# Patient Record
Sex: Female | Born: 1980 | Hispanic: Yes | Marital: Single | Smoking: Never smoker
Health system: Southern US, Community
[De-identification: ages and names within clinical notes are randomized; demographics above are authoritative.]

## PROBLEM LIST (undated history)

## (undated) DIAGNOSIS — R87619 Unspecified abnormal cytological findings in specimens from cervix uteri: Secondary | ICD-10-CM

## (undated) HISTORY — PX: NO PAST SURGERIES: SHX2092

## (undated) HISTORY — DX: Unspecified abnormal cytological findings in specimens from cervix uteri: R87.619

---

## 2017-06-30 ENCOUNTER — Encounter: Payer: Self-pay | Admitting: *Deleted

## 2017-06-30 ENCOUNTER — Encounter (INDEPENDENT_AMBULATORY_CARE_PROVIDER_SITE_OTHER): Payer: Self-pay

## 2017-06-30 ENCOUNTER — Ambulatory Visit: Payer: Self-pay | Attending: Oncology | Admitting: *Deleted

## 2017-06-30 VITALS — BP 175/116 | HR 86 | Temp 96.8°F | Resp 18 | Ht 65.0 in | Wt 206.0 lb

## 2017-06-30 DIAGNOSIS — R87810 Cervical high risk human papillomavirus (HPV) DNA test positive: Principal | ICD-10-CM

## 2017-06-30 DIAGNOSIS — R87610 Atypical squamous cells of undetermined significance on cytologic smear of cervix (ASC-US): Secondary | ICD-10-CM

## 2017-06-30 NOTE — Progress Notes (Signed)
36 year old Hispanic [facility] further evaluation of an abnormal pap of HPV positive ASCUS completed on 05/17/17.  Maritza, the interpreter present during the interview.  Reviewed pap results with the patient, and answered many questions.  Patient is very upset and anxious.  She is scheduled to see Dr. Jean RosenthalJackson on 07/16/17 @ 9:10.  Blood pressure elevated at 175/116 and then rechecked at 165/85.  She is to recheck her blood pressure at [facility] or [facility], and if remains higher than 140/90 she is to follow-up with her primary care provider.  Hand out on hypertention given to patient.  Offered support to the patient.  Encouraged to keep her appointment with Dr. Jean RosenthalJackson.  Patient has been screened for eligibility.  She does not have any insurance, Medicare or Medicaid.  She also meets financial eligibility.  Hand-out given on the Affordable Care Act.

## 2017-07-16 ENCOUNTER — Ambulatory Visit (INDEPENDENT_AMBULATORY_CARE_PROVIDER_SITE_OTHER): Payer: Self-pay | Admitting: Obstetrics and Gynecology

## 2017-07-16 ENCOUNTER — Encounter: Payer: Self-pay | Admitting: Obstetrics and Gynecology

## 2017-07-16 VITALS — BP 122/78 | Wt 206.0 lb

## 2017-07-16 DIAGNOSIS — R87610 Atypical squamous cells of undetermined significance on cytologic smear of cervix (ASC-US): Secondary | ICD-10-CM

## 2017-07-16 DIAGNOSIS — R87810 Cervical high risk human papillomavirus (HPV) DNA test positive: Secondary | ICD-10-CM

## 2017-07-16 NOTE — Progress Notes (Addendum)
Referring Provider:  [facility]  HPI:  Shelly Gillespie is a 36 y.o.  G2P2002  who presents today for evaluation and management of abnormal cervical cytology.    Dysplasia History:  ASCUS, HPV +   OB History  Gravida Para Term Preterm AB Living  2 2 2     2   SAB TAB Ectopic Multiple Live Births          2    # Outcome Date GA Lbr Len/2nd Weight Sex Delivery Anes PTL Lv  2 Term           1 Term               Past Medical History:  Diagnosis Date  . Abnormal Pap smear of cervix     History reviewed. No pertinent surgical history.  SOCIAL HISTORY:  Social History   Substance and Sexual Activity  Alcohol Use Yes  . Frequency: Never    Social History   Substance and Sexual Activity  Drug Use No     History reviewed. No pertinent family history.  ALLERGIES:  Patient has no known allergies.  No current outpatient medications on file prior to visit.   No current facility-administered medications on file prior to visit.     Physical Exam: -Vitals:  BP 122/78   Wt 206 lb (93.4 kg)   LMP 06/20/2017   BMI 34.28 kg/m  GEN: WD, WN, NAD.  A+ O x 3, good mood and affect. ABD:  NT, ND.  Soft, no masses.  No hernias noted.   Pelvic:   Vulva: Normal appearance.  No lesions.  Vagina: No lesions or abnormalities noted.  Support: Normal pelvic support.  Urethra No masses tenderness or scarring.  Meatus Normal size without lesions or prolapse.  Cervix: See below.  Anus: Normal exam.  No lesions.  Perineum: Normal exam.  No lesions.        Bimanual   Uterus: Normal size.  Non-tender.  Mobile.  AV.  Adnexae: No masses.  Non-tender to palpation.  Cul-de-sac: Negative for abnormality.   PROCEDURE: 1.  Urine Pregnancy Test:  negative 2.  Colposcopy performed with 4% acetic acid after verbal consent obtained                                         -Aceto-white Lesions Location(s): diffusely               -Biopsy performed at 2,  5, 7, and 10 o'clock               -ECC indicated and performed: No.       -Biopsy sites made hemostatic with pressure, AgNO3, and/or Monsel's solution   -Satisfactory colposcopy: Yes.        -Evidence of Invasive cervical CA :  NO  ASSESSMENT:  Shelly Gillespie is a 36 y.o. [id number] here for  1. ASCUS with positive high risk HPV cervical   .  PLAN: I discussed the grading system of pap smears and HPV high risk viral types.  We will discuss and base management after colpo results return.       Thomasene MohairStephen Lorece Keach, MD  [facility], [facility] 07/16/2017  4:58 PM   CC: [facility]   [facility] [place], Isa RankinKimberly Niles, MD [address] [place], [place] [id number]

## 2017-07-26 LAB — PATHOLOGY

## 2017-07-30 ENCOUNTER — Telehealth: Payer: Self-pay | Admitting: Obstetrics and Gynecology

## 2017-07-30 NOTE — Telephone Encounter (Signed)
Left generic VM using Spanish interpreter [phone]([id number]).

## 2017-07-30 NOTE — Telephone Encounter (Signed)
Pt is returning call. Please advise  °

## 2017-08-04 NOTE — Telephone Encounter (Signed)
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   Discussed risk of progression to cancer, if procedure is not done.   She voiced understanding and agreement to proceed.  Interpreter [phone]  Thomasene MohairStephen Rayven Rettig, MD 08/04/2017 1:37 PM

## 2017-08-05 ENCOUNTER — Telehealth: Payer: Self-pay | Admitting: *Deleted

## 2017-08-05 NOTE — Telephone Encounter (Addendum)
Left message via Maryjane HurterOtto, the interpreter for the patient to return my call.  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 Jean RosenthalJackson would like to do a LEEP.  If she is a [place] citizen, I can have her apply for [facility] Medicaid.  Patient returned my call via the interpreter Erin.  States she is not a [place] citizen, nor does she have a permanent residency card.  I cannot get the patient Medicaid.  Discussed options of self pay or to complete patient financial paperwork.  Since I could guarantee how long it would take to get financial approval, she wants to be self pay.  Called Marella BileNancy Cohen at [facility] and informed her of patients decision.  She is going to call and inform the patient how much the procedure will cost and get her scheduled.  Harriett Sineancy is to inform the patient she can call me back if she changes her mind.

## 2017-08-06 ENCOUNTER — Telehealth: Payer: Self-pay | Admitting: Obstetrics and Gynecology

## 2017-08-06 NOTE — Telephone Encounter (Signed)
-----   Message from Conard NovakStephen D Jackson, MD sent at 08/04/2017  1:38 PM EST ----- Regarding: schedule LEEP Surgery Booking Request Patient Full Name:  Shelly Gillespie  MRN: [id number]  DOB: 1981-04-25  Surgeon: Thomasene MohairStephen Jackson, MD  Requested Surgery Date and Time: when possible Primary Diagnosis AND Code: CIN 2 Secondary Diagnosis and Code:  Surgical Procedure: LEEP procedure L&D Notification: No Admission Status: IN OFFICE PROCEDURE Length of Surgery: 45 minutes Special Case Needs: Spanish language interpreter H&P: n/a (date) Phone Interview???: n/a Interpreter: Spanish Language Language:  Medical Clearance: none Special Scheduling Instructions: schedule a little extra time due to need for translator.

## 2017-08-06 NOTE — Telephone Encounter (Signed)
I contacted Christy @ [facility] yesterday, since the patient's colpo was [facility], to see if the LEEP would also be covered. I received a call back from [place], who said the patient is not a [place] Citizen, so [facility] will not cover, but that she offered for the patient to apply for financial assistance through [facility], or she could self-pay. Sheena said the patient did not want to wait for an application through [facility] to process, and was interested in self-pay. I contacted the patient through [facility] Johnny BridgeMartha, [place] [phone], and confirmed that the patient wanted to self-pay. I gave her the cost of the procedure with the self-pay discount, which needs to be paid in full the day of the procedure, and gave her the total cost of the procedure without the discount, which she could pay on a payment plan after the procedure. The patient chose the self-pay day of procedure option, and understands if she does not pay that day, that the total cost will be billed to her afterwards, and the self- pay discount will not apply (this information was confirmed with Molli KnockFelicia George earlier today). I also suggested that the patient call Sheena about possibly pursuing financial assistance since her appointment is 2 week out, in case they are able to process the application within that time frame, and patient understands that is up to her if she wants to pursue that option.

## 2017-08-23 ENCOUNTER — Ambulatory Visit: Payer: Self-pay | Admitting: Obstetrics and Gynecology

## 2017-08-23 ENCOUNTER — Telehealth: Payer: Self-pay | Admitting: Obstetrics and Gynecology

## 2017-08-23 NOTE — Telephone Encounter (Signed)
Pt is calling to reschedule due mense. Per SDJ patient to reschedule 08/23/17. Please call and reschedule with Interupter.

## 2017-08-24 NOTE — Telephone Encounter (Signed)
I spoke to the patient with [address] [employer], [place], [place]# [phone], and the patient is rescheduled for Friday, 09/10/17 @ 8:30am.

## 2017-09-10 ENCOUNTER — Encounter: Payer: Self-pay | Admitting: Obstetrics and Gynecology

## 2017-09-10 ENCOUNTER — Ambulatory Visit (INDEPENDENT_AMBULATORY_CARE_PROVIDER_SITE_OTHER): Payer: Self-pay | Admitting: Obstetrics and Gynecology

## 2017-09-10 VITALS — BP 118/76 | Wt 210.0 lb

## 2017-09-10 DIAGNOSIS — D069 Carcinoma in situ of cervix, unspecified: Secondary | ICD-10-CM | POA: Insufficient documentation

## 2017-09-10 DIAGNOSIS — N871 Moderate cervical dysplasia: Secondary | ICD-10-CM

## 2017-09-10 LAB — POCT URINE PREGNANCY: Preg Test, Ur: NEGATIVE

## 2017-09-10 NOTE — Patient Instructions (Signed)
Prevencin del cncer de cuello uterino Preventing Cervical Cancer El cncer de cuello uterino es un tipo de cncer que se desarrolla en el cuello uterino. El cuello uterino est en la parte inferior del tero. Conecta el tero con la vagina. El tero es el lugar donde el beb se desarrolla durante el embarazo. El cncer ocurre cuando algunas clulas comienzan a tener anomalas y crecen de forma descontrolada. El cncer de cuello uterino se desarrolla lentamente y podra no causar ningn sntoma al principio. Con el paso del [place], el cncer puede desarrollarse a mayor profundidad en el tejido del cuello uterino y expandirse a otras zonas. Si se halla a tiempo, el cncer de cuello uterino puede tratarse de forma eficaz. Tambin puede tomar medidas para prevenir este tipo de cncer. La mayora de los casos de cncer de cuello uterino son causados por una ITS (infeccin de transmisin sexual) a raz de un virus llamado virus del Geneticist, molecular (VPH). Una forma de disminuir el riesgo de Geophysical data processor de cuello uterino es evitar la infeccin por el VPH. Shelly Gillespie, debe practicar sexo con proteccin y colocarse la vacuna contra el VPH. Tambin es importante que se realice pruebas de Papanicolaou con regularidad, ya que estas pueden ayudar a identificar cambios en las clulas que podran Multimedia programmer. Las posibilidades de Environmental education officer esta enfermedad tambin pueden reducirse realizando ciertos cambios en el estilo de vida. Cmo puedo protegerme contra el cncer de cuello uterino? Prevencin contra la infeccin por VPH  Pregntele a su mdico acerca de cmo recibir la Animal nutritionist VPH. Si tiene 26aos o menos, es posible que deba colocarse esta vacuna, que se administra en tres dosis durante . Esta vacuna la protege contra los tipos de VPH que podran Multimedia programmer.  Limite la cantidad de personas con las que [employer]. Adems, evite tener sexo con personas que han  tenido demasiadas parejas sexuales.  Use preservativos de ltex durante las relaciones sexuales. Realizacin de las pruebas de Papanicolaou  Desde los 21aos, realcese pruebas de Papanicolaou con regularidad. Hable con el mdico sobre la frecuencia con la que debe hacerse estas pruebas. ? [employer] de las mujeres que Kaplan [id number] deben realizarse una prueba de Papanicolaou cada 3aos. ? Katha Hamming de las mujeres que Trona [id number] deben realizarse una prueba de Papanicolaou y Neomia Dear prueba del VPH cada 5aos. ? Las mujeres que presentan un mayor riesgo de Geophysical data processor de cuello uterino, como aquellas con un debilitamiento del sistema inmunitario o aquellas que han estado expuestas al frmaco dietilestilbestrol (DES), posiblemente deban realizarse las pruebas con mayor frecuencia. Realizacin de otros cambios en el estilo de vida  No consuma ningn producto que contenga nicotina o tabaco, como cigarrillos y Administrator, Civil Service. Si necesita ayuda para dejar de fumar, consulte al mdico.  Consuma, como mnimo, 5porciones de frutas y verduras todos [employer].  Baje de peso si es necesario. Por qu son importantes estos cambios?  Con estos cambios y [address] de [address], se abordan los factores que, segn los expertos, [place] el riesgo de Marine scientist cncer de cuello uterino. La mejor forma de disminuir el riesgo es seguir estos pasos.  Realizarse pruebas de Papanicolaou con regularidad ayudar a identificar cambios en las clulas que podran Multimedia programmer. Luego, se podrn tomar medidas para evitar el desarrollo del cncer.  Estos cambios tambin ayudarn a Market researcher de cuello uterino a tiempo. Si se halla a tiempo, el cncer de cuello uterino puede tratarse de forma eficaz.  Puede ser ms peligroso y difcil de tratar si el cncer se ha desarrollado a mayor profundidad en el cuello uterino o si se ha expandido.  Adems de hacer que sea menos propenso  a padecer cncer de cuello uterino, estos cambios [place]. I. du Ponttambin le otorgarn otros beneficios a su salud, como los que se describen a continuacin: ? Practicar sexo con proteccin es importante para prevenir las ITS y los embarazos no deseados. ? Evitar el consumo de tabaco puede disminuir el riesgo de padecer otros tipos de cncer y problemas cardacos. ? Seguir una dieta saludable y mantener un peso saludable son buenos para su salud general. Qu puede suceder si no se hacen cambios? En su etapa inicial, el cncer de cuello uterino posiblemente no tenga sntomas. El cncer puede tardar varios aos en desarrollarse y expandirse a mayor profundidad en el tejido del cuello uterino. Esta situacin podra ocurrir sin que usted lo sepa. Si desarrolla algn sntoma, como dolor en la pelvis o alguna secrecin o sangrado inusuales de la vagina, debe consultar con su mdico de inmediato. Si el cncer de cuello uterino no se halla a tiempo, podra tener que someterse a ciertos tratamientos, como radiacin, quimioterapia o Azerbaijanciruga. En algunos casos, realizarse una ciruga podra implicar que no podr quedar embarazada o completar un embarazo con xito. Dnde encontrar apoyo: Hable con el mdico, el enfermero de la escuela o el departamento de salud de su localidad para obtener orientacin sobre los estudios de deteccin y la vacunacin. Es posible que algunos nios y adolescentes puedan colocarse la vacuna contra el VPH sin cargo por medio del programa Vacunas para Nios (Vaccines for Emerson ElectricChildren,VFC) del gobierno de EE.[place]. Otros lugares en los que se vacuna:  Clnicas de salud pblica. Pregunte al departamento de salud de su localidad.  Centros de salud federalmente calificados, donde solo debera pagar lo que est a su alcance. Para encontrar alguno cerca de su lugar de residencia, consulte este sitio web: http://weiss.info/www.fqhc.org/find-an-fqhc/.  Clnicas de salud rural. Estas son parte de un programa para pacientes de  Medicare y de [place] que viven en zonas rurales.  El ShawnachesterPrograma Nacional de Deteccin Temprana del Cncer de Lime VillageMama y de Cuello Uterino de EE.[place] tambin proporciona exmenes de deteccin y servicios de diagnstico de estos tipos de cncer a mujeres de bajos recursos, sin cobertura mdica o con coberturas inadecuadas. El cncer de cuello uterino puede transmitirse de [place]res a hijas. Hable con el mdico o el asesor gentico para obtener ms informacin sobre las pruebas genticas para Management consultantel cncer. Dnde encontrar ms informacin: Puede obtener ms informacin sobre el cncer de cuello uterino Teachers Insurance and Annuity Associationen los siguientes sitios:  Colegio Americano de Print production plannerGinecologa y Ship brokerbstetricia (American College of Gynecology): HardChecks.bewww.acog.org/Patients/FAQs/Cervical-Cancer  Sociedad Education officer, communityAmericana contra el Cncer (American Cancer Society): www.cancer.org/cancer/cervicalcancer/  Centros para el Control y Psychiatristla Prevencin de Enfermedades(Centers for Disease Control and Prevention, CDC):www.cdc.gov/cancer/cervical/  Resumen  Converse con su mdico acerca de cmo recibir la Animal nutritionistvacuna contra el VPH.  Asegrese de realizarse pruebas de Papanicolaou con regularidad segn las recomendaciones del mdico.  Si siente algn dolor en la pelvis o tiene alguna secrecin o sangrado inusuales de la vagina, consulte con su mdico de inmediato. Esta informacin no tiene Theme park managercomo fin reemplazar el consejo del mdico. Asegrese de hacerle al mdico cualquier pregunta que tenga. Document Released: 10/28/2016 Document Revised: 10/28/2016 Document Reviewed: 08/04/2015 Elsevier Interactive Patient Education  2018 ArvinMeritorElsevier Inc.   Procedimiento de escisin electroquirrgica con asa, cuidados posteriores (Loop Electrosurgical Excision Procedure, Care After) Siga estas instrucciones durante  las prximas semanas. Estas indicaciones le proporcionan informacin acerca de cmo deber cuidarse despus del procedimiento. El mdico tambin podr darle instrucciones ms  especficas. El tratamiento ha sido planificado segn las prcticas mdicas actuales, pero en algunos casos pueden ocurrir problemas. Comunquese con el mdico si tiene algn problema o dudas despus del procedimiento. QU ESPERAR DESPUS DEL PROCEDIMIENTO Despus del procedimiento, es comn DIRECTV siguientes sntomas:  Clicos abdominales similares a los [employer]. Estos pueden durar Merrill Lynch.  Secrecin vaginal rosada o sanguinolenta, incluido sangrado leve a moderado, durante 1 o 2semanas.  Secrecin vaginal oscura. Esto se debe a la pasta que le aplicaron en el cuello del tero para controlar el sangrado. INSTRUCCIONES PARA EL CUIDADO EN EL HOGAR Actividad  Reanude sus actividades normales como se lo haya indicado el mdico. Pregntele al mdico qu actividades son seguras para usted.  Evite la actividad fsica extenuante durante el tiempo que le haya indicado el mdico.  No levante objetos que pesen ms de 10libras (4,5kg) hasta que el mdico le diga que es seguro. El bao  No tome baos de inmersin, no nade ni use el jacuzzi hasta que el mdico lo autorice.  Puede ducharse. Estilo de vida  No se introduzca nada en la vagina durante 2semanas despus del procedimiento o hasta que el mdico la autorice. Esto incluye los tampones, las cremas y las duchas vaginales.  No tenga relaciones sexuales hasta que el mdico la autorice. Instrucciones generales  [employer] de venta libre y los recetados solamente como se lo haya indicado el mdico.  Oceanographer a todas las visitas de control como se lo haya indicado el mdico. Esto es importante. SOLICITE ATENCIN MDICA SI:  Tiene fiebre o siente escalofros.  Se siente dbil de un modo que no es habitual.  Tiene un sangrado vaginal que es ms abundante o dura ms que una menstruacin normal. Un signo de esto puede ser empapar un apsito con sangre.  Siente dolor intenso.  Tiene nuseas o  vmitos.  Tiene una secrecin vaginal con mal olor. Esta informacin no tiene Theme park manager el consejo del mdico. Asegrese de hacerle al mdico cualquier pregunta que tenga. Document Released: 04/02/2011 Document Revised: 10/12/2011 Document Reviewed: 06/03/2015 Elsevier Interactive Patient Education  2018 ArvinMeritor.

## 2017-09-10 NOTE — Progress Notes (Signed)
   LEEP PROCEDURE NOTE  The LEEP has been explained to the patient in detail; risks/benefits reviewed.  The risks include, but are not limited to, bleeding, infection, and the possibility of cervical stenosis or cervical incompetence.  The patient had previously been given information regarding abnormal PAP smears and their relationship to HPV.  We have discussed the natural course and history of HPV, the possibility of incomplete treatment by the LEEP, as well as the possibility of recurrence.  I have reviewed the consent form for LEEP with her, and she fully understands its contents.  We have discussed the procedure itself. I have informed her that following the LEEP she should refrain from intercourse and the use of tampons for three weeks, and that she should also expect some spotting and brown/black discharge over the next several days.  We have discussed the fact that vaginal bleeding, differentiated from spotting, is not normal and that if she should have this complication, she should contact me immediately.  The follow-up after LEEP will be PAP smears or viral typing performed at regular intervals for up to 3-5 years.  Should these all prove to be normal, she will then be back on typical cervical screening.  I have answered all of her questions, and I believe she has an adequate understanding of the LEEP, its implications, and the necessity of follow-up care.  I discussed her colpo results and explained the procedure of LEEP.  All questions were answered and she signed the consent form.    LEEP performed in the usual manner after reviewing the previous colpo findings and results. Lugol's solution was usesd to identify any abnormal areas of the cervix.  The cervix was cleansed with betadine solution. Local injection of Lidocaine was performed for anesthesia.  Ectocervical and then endocervical specimens obtained using the loop electrodes without difficulty.  ECC was also performed. It was labeled  accordingly. The base and edges of the defect was then cauterized using coagulation current.  Monsel's solution was applied for continued hemostasis.  The patient tolerated the procedure well.  Instructions given to the patient at the end of the procedure.  Return in about 4 weeks (around 10/08/2017) for post op check after LEEP with Dr Jean RosenthalJackson.   Thomasene MohairStephen Yohanna Tow, MD 09/10/2017 9:45 AM

## 2017-09-10 NOTE — Addendum Note (Signed)
Addended by: Liliane ShiSHAW, BEVERLY M on: 09/10/2017 03:59 PM   Modules accepted: Orders

## 2017-09-15 LAB — PATHOLOGY

## 2017-10-08 ENCOUNTER — Ambulatory Visit (INDEPENDENT_AMBULATORY_CARE_PROVIDER_SITE_OTHER): Payer: Self-pay | Admitting: Obstetrics and Gynecology

## 2017-10-08 DIAGNOSIS — D069 Carcinoma in situ of cervix, unspecified: Secondary | ICD-10-CM

## 2017-10-08 NOTE — Progress Notes (Signed)
   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  Subjective: Eating a regular diet without difficulty. The patient is not having any pain.  Activity: normal activities of daily living. Denies vaginal bleeding.  Objective: Vitals:   10/08/17 1053  BP: 128/84   Vital Signs: BP 128/84   Wt 210 lb (95.3 kg)   BMI 34.95 kg/m  Physical Exam  Constitutional: She is oriented to person, place, and time. She appears well-developed and well-nourished. No distress.  Genitourinary: Vagina normal and uterus normal. Pelvic exam was performed with patient supine. There is no rash, tenderness or lesion on the right labia. There is no rash, tenderness or lesion on the left labia. Vagina exhibits no lesion. No erythema, tenderness or bleeding in the vagina. No signs of injury around the vagina. Right adnexum does not display mass, does not display tenderness and does not display fullness. Left adnexum does not display mass, does not display tenderness and does not display fullness. Cervix does not exhibit motion tenderness, lesion or polyp.     Uterus is mobile. Uterus is not enlarged, tender, exhibiting a mass or irregular (is regular).  HENT:  Head: Normocephalic and atraumatic.  Eyes: Conjunctivae are normal. No scleral icterus.  Cardiovascular: Normal rate and regular rhythm.  Pulmonary/Chest: Effort normal and breath sounds normal. No respiratory distress.  Abdominal: Soft. Bowel sounds are normal. She exhibits no distension and no mass. There is no tenderness. There is no guarding.  Musculoskeletal: Normal range of motion. She exhibits no edema.  Neurological: She is alert and oriented to person, place, and time. No cranial nerve deficit.  Psychiatric: She has a normal mood and affect. Her behavior is normal. Judgment normal.       Assessment: 37 y.o. 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    Plan: Patient has done well after surgery with no apparent complications.  I have discussed the post-operative course to date, and the expected progress moving forward.  The patient understands what complications to be concerned about.  I will see the patient in routine follow up, or sooner if needed.    Activity plan: No restriction.  I discussed the pathology findings with the patient.  Discussed that we had a couple of options for treating positive margins on pathology sample.  Option one would be to perform an additional excisional procedure to remove the positive margins.  Option 2 would be to repeat cytology and endocervical curettage in 4-6 months.  Given that the patient paid for the procedure out of pocket, she elects to have follow-up cytology with ECC in 4-6 months.  I will coordinate through the [facility] program to have follow-up.  Thomasene MohairStephen Eleisha Branscomb, MD 10/08/2017, 11:07 AM   CC: [facility] Program Molli PoseySheena Lambert, RN

## 2017-10-10 ENCOUNTER — Encounter: Payer: Self-pay | Admitting: Obstetrics and Gynecology

## 2017-10-19 ENCOUNTER — Encounter: Payer: Self-pay | Admitting: *Deleted

## 2017-10-19 NOTE — Progress Notes (Signed)
Spoke to patient yesterday via Junie PanningLuisana [phone], the interpreter, in regards to her follow-up with Dr. Jean RosenthalJackson in 4-6 months.  Explained that he would like to do another colposcopy and ECC at that.  Reviewed that [facility] could cover the cost of that appointment and her biopsy.  She [facility].  I tried to explain that [facility] does pay [facility] bills, but not for the LEEP procedure.  Informed her I would send her a letter with her next appointment.  I have scheduled the patient to see Dr. Jean RosenthalJackson on 02/18/18 @ 2:00.  Will mail letter with the appointment.

## 2018-02-18 ENCOUNTER — Other Ambulatory Visit: Payer: Self-pay

## 2018-02-18 ENCOUNTER — Other Ambulatory Visit (HOSPITAL_COMMUNITY)
Admission: RE | Admit: 2018-02-18 | Discharge: 2018-02-18 | Disposition: A | Discharge status: Home / Self Care | Payer: Self-pay | Source: Ambulatory Visit | Attending: Obstetrics and Gynecology | Admitting: Obstetrics and Gynecology

## 2018-02-18 ENCOUNTER — Encounter: Payer: Self-pay | Admitting: Obstetrics and Gynecology

## 2018-02-18 ENCOUNTER — Ambulatory Visit (INDEPENDENT_AMBULATORY_CARE_PROVIDER_SITE_OTHER): Payer: Self-pay | Admitting: Obstetrics and Gynecology

## 2018-02-18 VITALS — BP 128/66 | HR 83 | Wt 210.0 lb

## 2018-02-18 DIAGNOSIS — D069 Carcinoma in situ of cervix, unspecified: Secondary | ICD-10-CM

## 2018-02-18 NOTE — Progress Notes (Signed)
Obstetrics & Gynecology Office Visit   Chief Complaint  Patient presents with  . Follow-up    Positive margin on LEEP specimen    History of Present Illness: 37 y.o. 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  The procedure occurred on 09/10/17. The final pathology of this procedure was CIN 2,3 extending to the ectocervical margin. The patient presents today based on ASCCP-guideline recommended follow up of cytology and endocervical curettage.  She has no acute complaints today.   Past Medical History:  Diagnosis Date  . Abnormal Pap smear of cervix     Past Surgical History:  Procedure Laterality Date  . NO PAST SURGERIES      Gynecologic History: Patient's last menstrual period was 02/10/2018.  Obstetric History: [id number]  Family History: Denies history of gynecologic cancer  Social History   Socioeconomic History  . Marital status: Single    Spouse name: Not on file  . Number of children: Not on file  . Years of education: Not on file  . Highest education level: Not on file  Occupational History  . Not on file  Social Needs  . Financial resource strain: Not on file  . Food insecurity:    Worry: Not on file    Inability: Not on file  . Transportation needs:    Medical: Not on file    Non-medical: Not on file  Tobacco Use  . Smoking status: Never Smoker  . Smokeless tobacco: Never Used  Substance and Sexual Activity  . Alcohol use: Yes    Frequency: Never  . Drug use: No  . Sexual activity: Yes    Birth control/protection: None  Lifestyle  . Physical activity:    Days per week: Not on file    Minutes per session: Not on file  . Stress: Not on file  Relationships  . Social connections:    Talks on phone: Not on file    Gets together: Not on file    Attends religious service: Not on file    Active member of club or organization: Not on file    Attends meetings of clubs or organizations: Not on  file    Relationship status: Not on file  . Intimate partner violence:    Fear of current or ex partner: Not on file    Emotionally abused: Not on file    Physically abused: Not on file    Forced sexual activity: Not on file  Other Topics Concern  . Not on file  Social History Narrative  . Not on file    No Known Allergies  Prior to Admission medications   Denies    Review of Systems  Constitutional: Negative.   HENT: Negative.   Eyes: Negative.   Respiratory: Negative.   Cardiovascular: Negative.   Gastrointestinal: Negative.   Genitourinary: Negative.   Musculoskeletal: Negative.   Skin: Negative.   Neurological: Negative.   Psychiatric/Behavioral: Negative.      Physical Exam BP 128/66 (BP Location: Left Arm, Patient Position: Sitting, Cuff Size: Normal)   Pulse 83   Wt 210 lb (95.3 kg)   LMP 02/10/2018   BMI 34.95 kg/m  Patient's last menstrual period was 02/10/2018. Physical Exam  Constitutional: She is oriented to person, place, and time. She appears well-developed and well-nourished. No distress.  Genitourinary: Vagina normal. Pelvic exam was performed with patient supine. There is no rash, tenderness or lesion on the right labia. There is  no rash, tenderness or lesion on the left labia. Cervix does not exhibit motion tenderness or lesion.  Genitourinary Comments: Cells collected with spatula for cytology. ECC performed with endocervical curette and cytobrush.   HENT:  Head: Normocephalic and atraumatic.  Eyes: Conjunctivae are normal. No scleral icterus.  Pulmonary/Chest: Breath sounds normal.  Neurological: She is alert and oriented to person, place, and time. No cranial nerve deficit.  Psychiatric: She has a normal mood and affect. Her behavior is normal. Judgment normal.   Female chaperone present for pelvic and/or breast  portions of the physical exam  Assessment: 37 y.o. 882P2002 female here for  1. CIN III (cervical intraepithelial neoplasia grade  III) with severe dysplasia      Plan: Problem List Items Addressed This Visit      Genitourinary   CIN III (cervical intraepithelial neoplasia grade III) with severe dysplasia - Primary   Relevant Orders   Cytology - PAP   Surgical pathology     Per ASSCP guidelines, pap smear and ECC performed today.  Follow up will be based on results.   Thomasene MohairStephen Chesney Klimaszewski, MD 02/18/2018 2:47 PM     CC: [facility] Program at [facility]   [facility] [place] [place], Isa RankinKimberly Niles, MD [address] [place] [address]

## 2018-02-21 LAB — CYTOLOGY - PAP
Diagnosis: NEGATIVE
HPV: NOT DETECTED

## 2018-02-24 ENCOUNTER — Encounter: Payer: Self-pay | Admitting: Obstetrics and Gynecology

## 2018-03-03 ENCOUNTER — Encounter: Payer: Self-pay | Admitting: *Deleted

## 2018-03-03 NOTE — Progress Notes (Signed)
Received inbasket message from Dr. Jean RosenthalJackson with recent negative / negative pap smear and benign cervical pathology.  He recommends follow up pap and colpo with ECC in 6 months and if normal, he states next pap in 1 year until 2 negative pap smears.  Called patient via Aquilla SolianLloyda and [facility] her [facility] so we can pay for her next appointment with Dr. Jean RosenthalJackson.  Reviewed her pathology.  [facility] @ 1:00.  We will make sure she has a follow up appointment with Dr. Jean RosenthalJackson in January.

## 2018-07-18 ENCOUNTER — Ambulatory Visit: Payer: Self-pay

## 2018-07-18 ENCOUNTER — Ambulatory Visit: Payer: Self-pay | Admitting: Obstetrics and Gynecology

## 2018-07-25 ENCOUNTER — Ambulatory Visit (INDEPENDENT_AMBULATORY_CARE_PROVIDER_SITE_OTHER): Payer: Self-pay | Admitting: Obstetrics and Gynecology

## 2018-07-25 ENCOUNTER — Encounter (INDEPENDENT_AMBULATORY_CARE_PROVIDER_SITE_OTHER): Payer: Self-pay

## 2018-07-25 ENCOUNTER — Other Ambulatory Visit (HOSPITAL_COMMUNITY)
Admission: RE | Admit: 2018-07-25 | Discharge: 2018-07-25 | Disposition: A | Discharge status: Home / Self Care | Payer: Self-pay | Source: Ambulatory Visit | Attending: Obstetrics and Gynecology | Admitting: Obstetrics and Gynecology

## 2018-07-25 ENCOUNTER — Ambulatory Visit: Payer: Self-pay | Attending: Oncology

## 2018-07-25 ENCOUNTER — Other Ambulatory Visit: Payer: Self-pay

## 2018-07-25 ENCOUNTER — Encounter: Payer: Self-pay | Admitting: Obstetrics and Gynecology

## 2018-07-25 VITALS — BP 156/87 | HR 83 | Temp 98.1°F

## 2018-07-25 VITALS — BP 120/80 | HR 91 | Ht 65.5 in | Wt 196.0 lb

## 2018-07-25 DIAGNOSIS — D069 Carcinoma in situ of cervix, unspecified: Secondary | ICD-10-CM

## 2018-07-25 DIAGNOSIS — R87613 High grade squamous intraepithelial lesion on cytologic smear of cervix (HGSIL): Secondary | ICD-10-CM

## 2018-07-25 NOTE — Progress Notes (Signed)
Obstetrics & Gynecology Office Visit   Chief Complaint:  Chief Complaint  Patient presents with  . Referral    BCCCP repeat pap, positive marging on LEEP procedure    History of Present Illness: 37 y.o. 342P2002 female who underwent a LEEP procedure for CIN II on pathology after colposcopy for ASCUS--HPV+ pap smear .  The procedure occurred on 09/10/2017.  The final pathology of the LEEP procedure was CIN 2,3, extending to the ectocervical margin.  The patient presents today for ASCCP-guideline recommended follow up of cytology and endocervical curettage.  She has no acute complaints today.   Past Medical History:  Diagnosis Date  . Abnormal Pap smear of cervix     Past Surgical History:  Procedure Laterality Date  . NO PAST SURGERIES      Gynecologic History: Patient's last menstrual period was 07/12/2018.  Obstetric History: [id number]  History reviewed. No pertinent family history.  Social History   Socioeconomic History  . Marital status: Single    Spouse name: Not on file  . Number of children: Not on file  . Years of education: Not on file  . Highest education level: Not on file  Occupational History  . Not on file  Social Needs  . Financial resource strain: Not on file  . Food insecurity:    Worry: Not on file    Inability: Not on file  . Transportation needs:    Medical: Not on file    Non-medical: Not on file  Tobacco Use  . Smoking status: Never Smoker  . Smokeless tobacco: Never Used  Substance and Sexual Activity  . Alcohol use: Yes    Frequency: Never  . Drug use: No  . Sexual activity: Yes    Birth control/protection: None  Lifestyle  . Physical activity:    Days per week: Not on file    Minutes per session: Not on file  . Stress: Not on file  Relationships  . Social connections:    Talks on phone: Not on file    Gets together: Not on file    Attends religious service: Not on file    Active member of club or organization: Not on file   Attends meetings of clubs or organizations: Not on file    Relationship status: Not on file  . Intimate partner violence:    Fear of current or ex partner: Not on file    Emotionally abused: Not on file    Physically abused: Not on file    Forced sexual activity: Not on file  Other Topics Concern  . Not on file  Social History Narrative  . Not on file    No Known Allergies  Prior to Admission medications   Not on File    Review of Systems  Constitutional: Negative.   HENT: Negative.   Eyes: Negative.   Respiratory: Negative.   Cardiovascular: Negative.   Gastrointestinal: Negative.   Genitourinary: Negative.   Musculoskeletal: Negative.   Skin: Negative.   Neurological: Negative.   Psychiatric/Behavioral: Negative.      Physical Exam BP 120/80 (BP Location: Left Arm, Patient Position: Sitting, Cuff Size: Normal)   Pulse 91   Ht 5' 5.5" (1.664 m)   Wt 196 lb (88.9 kg)   LMP 07/12/2018   BMI 32.12 kg/m  Patient's last menstrual period was 07/12/2018. Physical Exam Constitutional:      General: She is not in acute distress.    Appearance: Normal appearance. She is not ill-appearing.  Genitourinary:  Pelvic exam was performed with patient supine.     Vulva, urethra, bladder and vagina normal.     No posterior fourchette injury or lesion present.     No cervical friability, lesion, bleeding or polyp.  Eyes:     General: No scleral icterus.    Conjunctiva/sclera: Conjunctivae normal.  Musculoskeletal: Normal range of motion.        General: No swelling.  Neurological:     General: No focal deficit present.     Mental Status: She is alert and oriented to person, place, and time.     Cranial Nerves: No cranial nerve deficit.  Skin:    General: Skin is warm and dry.     Coloration: Skin is not jaundiced.  Psychiatric:        Mood and Affect: Mood normal.        Behavior: Behavior normal.        Judgment: Judgment normal.     Female chaperone present for  pelvic and breast  portions of the physical exam  Assessment: 37 y.o. 642P2002 female here for  1. CIN III (cervical intraepithelial neoplasia grade III) with severe dysplasia      Plan: Problem List Items Addressed This Visit      Genitourinary   CIN III (cervical intraepithelial neoplasia grade III) with severe dysplasia - Primary   Relevant Orders   Cytology - PAP   Surgical pathology     Pap smear for cytology along with ECC performed today in accordance with ASCCP guidelines.  All questions answered.  Follow up based on results. If normal, will likely be able to go to annual pap smear screen.    Thomasene MohairStephen Janeil Schexnayder, MD 07/25/2018 2:27 PM     CC: [facility] Program at [facility]   [facility] [place], Isa RankinKimberly Niles, MD [address] [place], [place] [address]

## 2018-07-28 NOTE — Progress Notes (Signed)
37 year old returns for annual BCCCP screening, and eligibility. Patient screened, and meets BCCCP eligibility.  Patient does not have insurance, Medicare or Medicaid.  Handout given on Affordable Care Act.   She is scheduled for follow up with Dr. Jean RosenthalJackson today related to history of CINIII with LEEP  Treatment.

## 2018-08-01 LAB — CYTOLOGY - PAP
Diagnosis: NEGATIVE
HPV: NOT DETECTED

## 2018-08-09 ENCOUNTER — Telehealth: Payer: Self-pay | Admitting: Obstetrics and Gynecology

## 2018-08-09 ENCOUNTER — Encounter: Payer: Self-pay | Admitting: Obstetrics and Gynecology

## 2018-08-09 NOTE — Telephone Encounter (Signed)
Discussed normal results with the patient. I recommend the next step in follow up be a pap smear in one year. She voiced understanding and agreement with the plan.   We discussed the importance of following up to ensure her pap smears remain normal.  All questions answered.  Thomasene Mohair, MD, [facility], [facility] 08/09/2018 1:08 PM

## 2018-09-20 NOTE — Progress Notes (Signed)
Per Dr. Edison Pace request, patient is scheduled for 1 year follow-up pap on 07/26/2019. She will also be Eligible for a baseline mammogram at that time.  Mailed appointment info to patient, and notified Dr. Jean Rosenthal.  Copy to [facility].

## 2019-07-24 ENCOUNTER — Telehealth: Payer: Self-pay

## 2019-07-26 ENCOUNTER — Ambulatory Visit: Payer: Self-pay

## 2019-09-18 NOTE — Telephone Encounter (Signed)
Performed on 09/10/2017

## 2019-11-21 ENCOUNTER — Encounter (INDEPENDENT_AMBULATORY_CARE_PROVIDER_SITE_OTHER): Payer: Self-pay

## 2019-11-21 ENCOUNTER — Other Ambulatory Visit: Payer: Self-pay

## 2019-11-21 ENCOUNTER — Ambulatory Visit: Payer: Self-pay | Attending: Oncology

## 2019-11-21 VITALS — BP 129/59 | HR 73 | Temp 96.6°F | Ht 64.25 in | Wt 171.6 lb

## 2019-11-21 DIAGNOSIS — Z0000 Encounter for general adult medical examination without abnormal findings: Secondary | ICD-10-CM

## 2019-11-21 DIAGNOSIS — N63 Unspecified lump in unspecified breast: Secondary | ICD-10-CM

## 2019-11-21 NOTE — Progress Notes (Signed)
Ms  

## 2019-11-21 NOTE — Progress Notes (Signed)
  Subjective:     Patient ID: Shelly Gillespie, female   DOB: 07/08/1981, 39 y.o.   MRN: [id number]  HPI   Review of Systems     Objective:   Physical Exam Chest:     Breasts:        Right: No swelling, bleeding, inverted nipple, mass, nipple discharge, skin change or tenderness.        Left: Mass present. No swelling, bleeding, inverted nipple, nipple discharge, skin change or tenderness.       Comments: Nodule left breast lower inner quadrant.  Genitourinary:    Labia:        Right: No rash, tenderness, lesion or injury.        Left: No rash, tenderness, lesion or injury.      Vagina: No signs of injury and foreign body. No vaginal discharge, erythema, tenderness, bleeding, lesions or prolapsed vaginal walls.     Cervix: No cervical motion tenderness, discharge, friability, lesion, erythema, cervical bleeding or eversion.     Uterus: Not deviated, not enlarged, not fixed, not tender and no uterine prolapse.      Adnexa:        Right: No mass, tenderness or fullness.         Left: No mass, tenderness or fullness.          Assessment:     39 year old Hispanic patient presents for BCCCP clinic visit. Shelly Gillespie interpreted exam.  Referred back for annual pap follow-up.  History of CINIII in 2018. Patient screened, and meets BCCCP eligibility.  Patient does not have insurance, Medicare or Medicaid. Instructed patient on breast self awareness using teach back method.  Patient reports left breast nodule x 3 weeks.  No previous mammogram. Clinical breast exam reveals .5cm nodule lower inner left breast. Pelvic exam normal.  Risk Assessment    Risk Scores      11/21/2019 07/25/2018   Last edited by: Jim Like, RN Scarlett Presto, RN   5-year risk: 0.3 % 0.2 %   Lifetime risk: 5.2 % 5.2 %             Plan:     Scheduled for baseline mammogram 11/30/19.  Specimen collected for pap.

## 2019-11-25 LAB — IGP, APTIMA HPV: HPV Aptima: NEGATIVE

## 2019-11-30 ENCOUNTER — Ambulatory Visit
Admission: RE | Admit: 2019-11-30 | Discharge: 2019-11-30 | Disposition: A | Discharge status: Home / Self Care | Payer: Self-pay | Source: Ambulatory Visit | Attending: Oncology | Admitting: Oncology

## 2019-11-30 DIAGNOSIS — N63 Unspecified lump in unspecified breast: Secondary | ICD-10-CM

## 2019-11-30 DIAGNOSIS — Z0000 Encounter for general adult medical examination without abnormal findings: Secondary | ICD-10-CM | POA: Insufficient documentation

## 2019-12-01 ENCOUNTER — Encounter: Payer: Self-pay | Admitting: Family Medicine

## 2019-12-01 DIAGNOSIS — N6310 Unspecified lump in the right breast, unspecified quadrant: Secondary | ICD-10-CM | POA: Insufficient documentation

## 2019-12-05 ENCOUNTER — Other Ambulatory Visit: Payer: Self-pay | Admitting: *Deleted

## 2019-12-05 DIAGNOSIS — N63 Unspecified lump in unspecified breast: Secondary | ICD-10-CM

## 2019-12-11 ENCOUNTER — Ambulatory Visit
Admission: RE | Admit: 2019-12-11 | Discharge: 2019-12-11 | Disposition: A | Discharge status: Home / Self Care | Payer: Self-pay | Source: Ambulatory Visit | Attending: Oncology | Admitting: Oncology

## 2019-12-11 DIAGNOSIS — N63 Unspecified lump in unspecified breast: Secondary | ICD-10-CM

## 2019-12-11 HISTORY — PX: BREAST BIOPSY: SHX20

## 2019-12-12 LAB — SURGICAL PATHOLOGY

## 2019-12-14 ENCOUNTER — Encounter: Payer: Self-pay | Admitting: *Deleted

## 2019-12-14 NOTE — Progress Notes (Signed)
Received message from Randa Lynn, RN that patient had questions regarding her left breast, and could we give her a call.  I have left her a message via Maryjane Hurter, the interpreter for her to return my call.

## 2020-03-27 NOTE — Progress Notes (Signed)
Radiologist reviewed benign biopsy results with patient.  To return in one year for annual screening, and mammogram. Pap results negative/negative.  Per ASCCP guidelines patient to return to 3 year follow-up pap.  Copy to [facility].

## 2020-09-10 IMAGING — US US BREAST*R* LIMITED INC AXILLA
1 series · 11 of 11 positions shown · non-contrast
Comparison: None

CLINICAL DATA: 38-year-old patient presents for baseline mammogram
and evaluation of a recent lump in the 9 o'clock region of the left
breast. Today, she does not palpate the lump. Spanish interpreter
was present throughout the evaluation.

EXAM:
DIGITAL DIAGNOSTIC BILATERAL MAMMOGRAM WITH CAD AND TOMO
ULTRASOUND BILATERAL BREAST

[Series 1: us breast*right* limited inc axilla · 0.06mm/px · 11 of 11 slices shown]
[im 1/11]
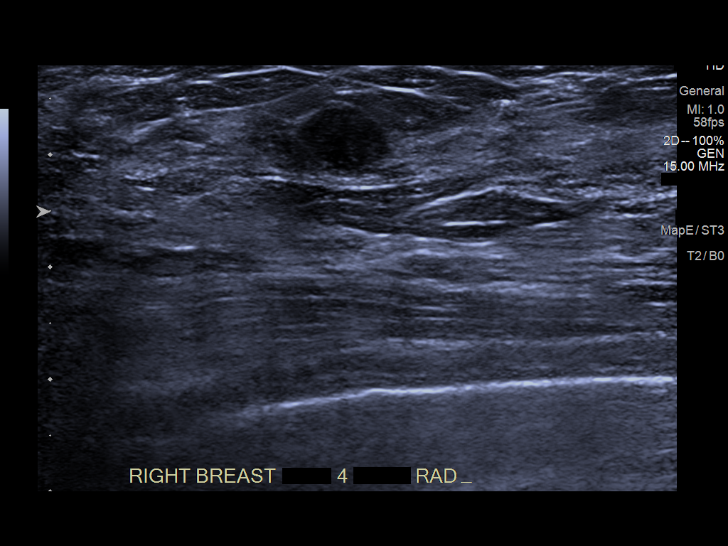
[im 2/11]
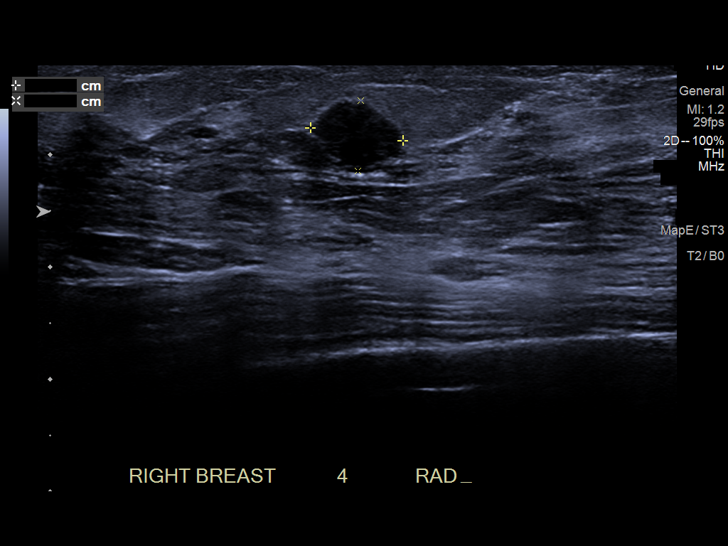
[im 3/11]
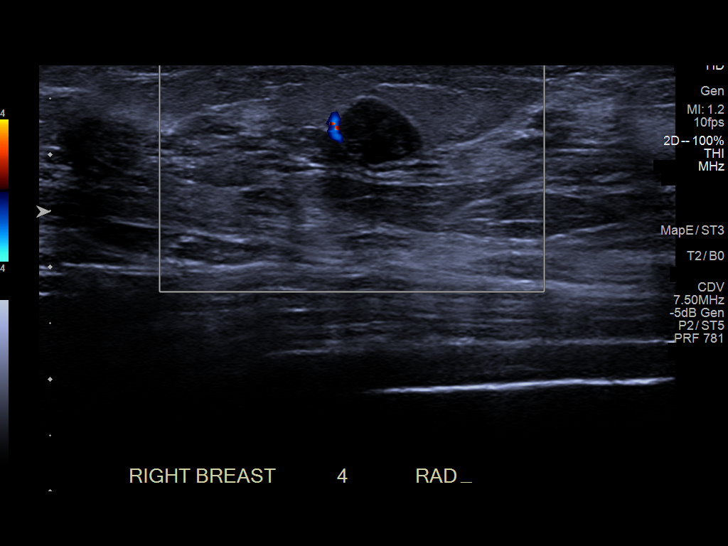
[im 4/11]
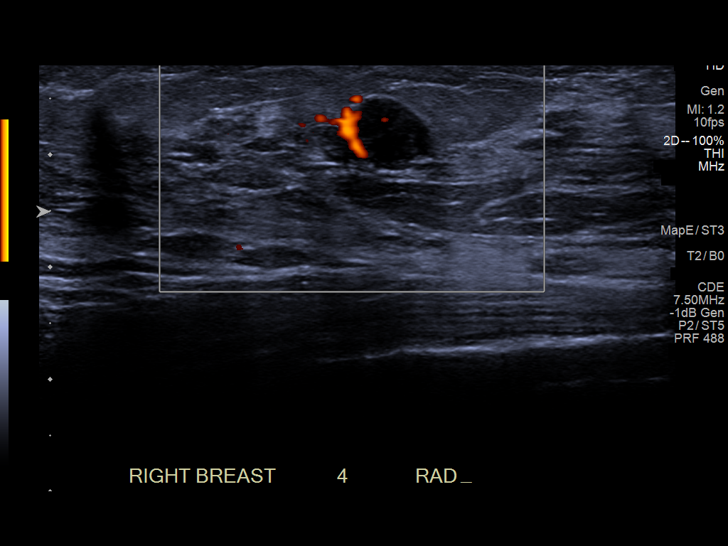
[im 5/11]
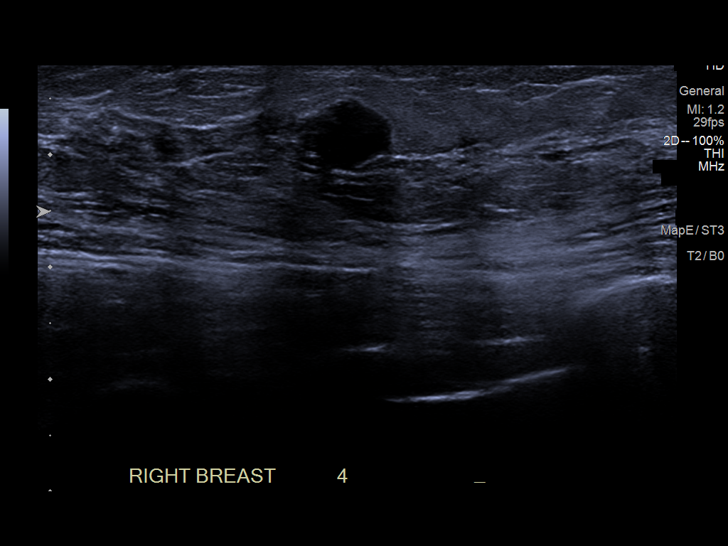
[im 6/11]
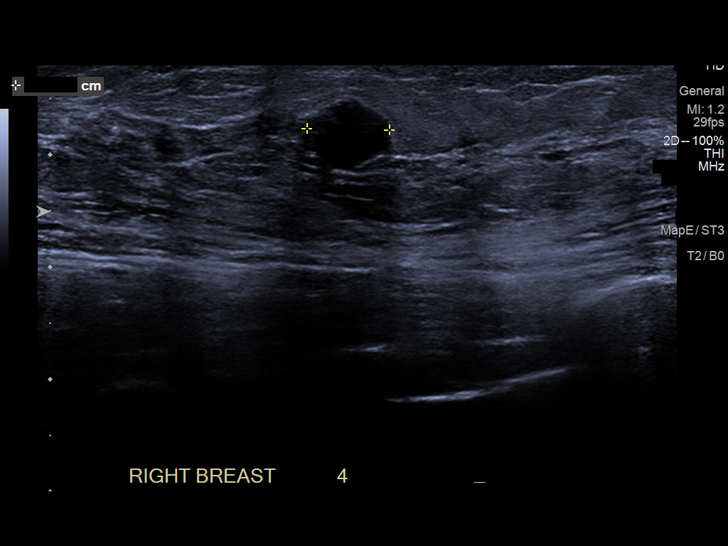
[im 7/11]
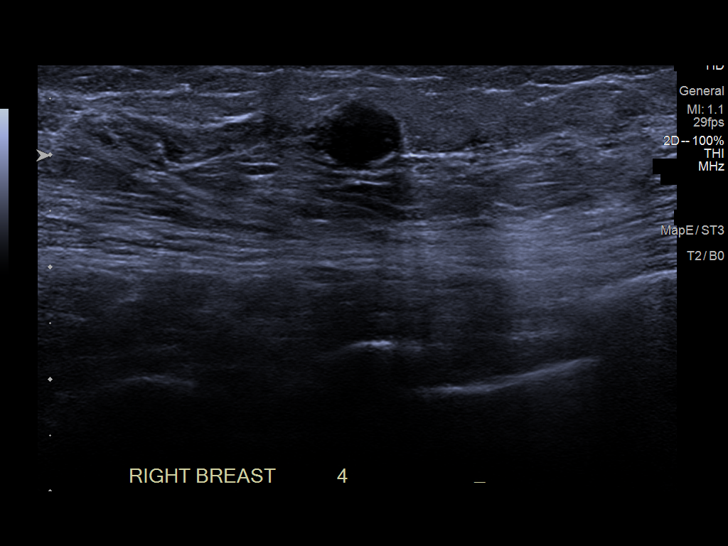
[im 8/11]
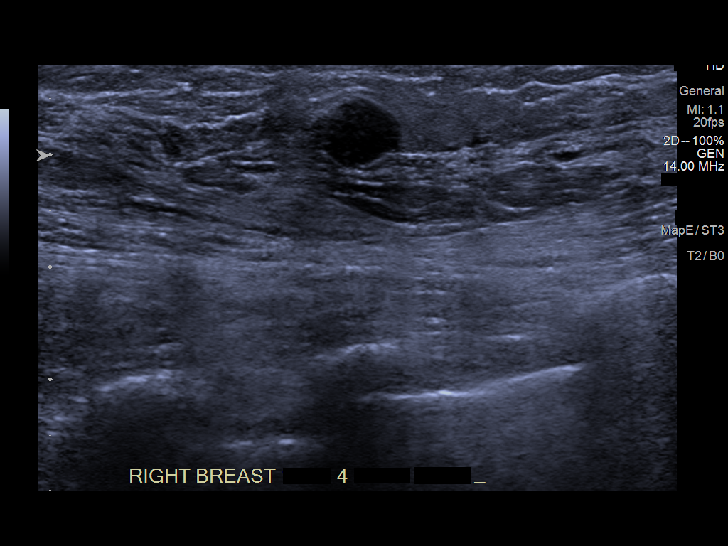
[im 9/11]
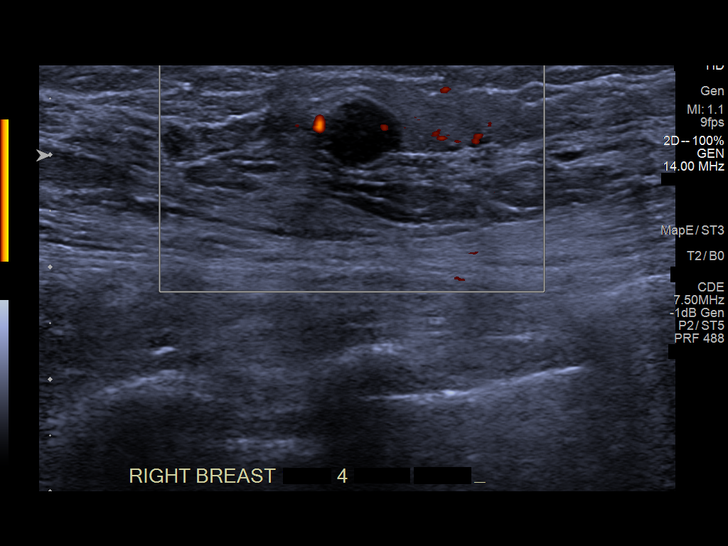
[im 10/11]
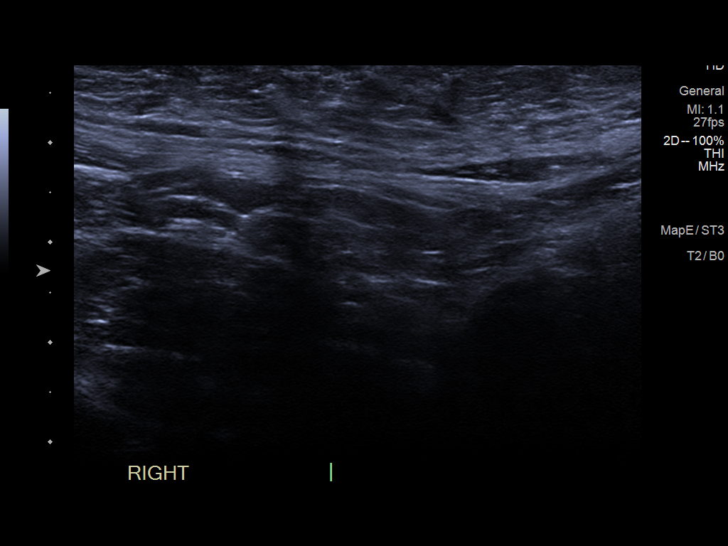
[im 11/11]
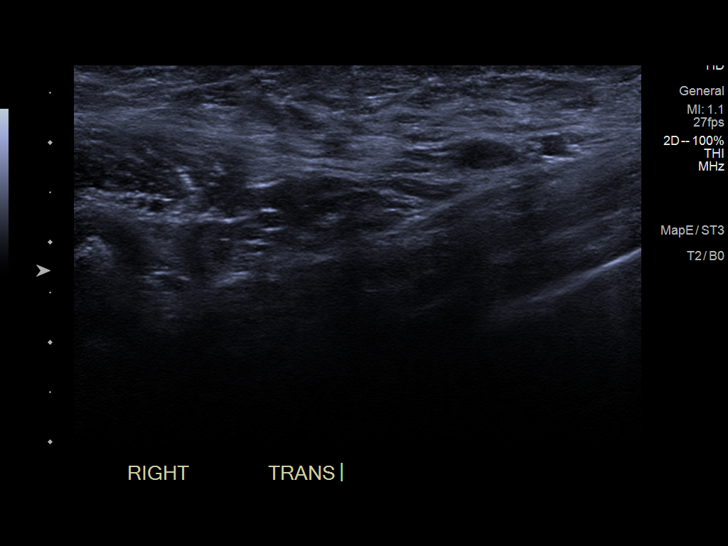

[11 of 11 positions shown; findings below may reference images not displayed]

ACR Breast Density Category c: The breast tissue is heterogeneously
dense, which may obscure small masses.
FINDINGS: Metallic skin marker placed in the region of recent patient concern
in the medial left breast. Spot tangential view of this region with
tomography is negative. There are no findings to suggest malignancy
in the left breast.

On the right, there is a partially circumscribed and partially
obscured/indistinct oval mass in the upper outer right breast. No
suspicious microcalcification or distortion in the right breast.

Mammographic images were processed with CAD.

On physical exam, no mass is palpated in the medial left breast in
the 9 o'clock to [DATE] region where the patient recently felt a lump.

In the right breast at 10 o'clock position 4 cm from the nipple is a
subtle palpable 1 cm lump.

Targeted ultrasound is performed, showing an oval heterogeneously
hypoechoic mass with some peripheral and internal vascular flow.
Mass is circumscribed with a few micro lobulations and measures
x 0.8 x 0.6 cm.

Ultrasound of the right axilla is negative for lymphadenopathy.
IMPRESSION: Indeterminate hypoechoic mass right breast 10 o'clock position 4 cm
from the nipple. Malignancy cannot be excluded.

No evidence of malignancy in the left breast.

RECOMMENDATION:
Ultrasound-guided core needle biopsy of the right breast mass at 10
o'clock position is recommended and will be scheduled for the
patient. The procedure for biopsy was discussed in detail with
assistance by the Spanish interpreter.

I have discussed the findings and recommendations with the patient.
If applicable, a reminder letter will be sent to the patient
regarding the next appointment.

BI-RADS CATEGORY  4: Suspicious.

## 2020-09-21 IMAGING — MG DIGITAL DIAGNOSTIC UNILAT RIGHT W/ CAD
5 series · 6 of 13 positions shown · non-contrast
Comparison: Previous exam(s).

CLINICAL DATA: Confirmation of clip placement after
ultrasound-guided core needle biopsy of an indeterminate mass
involving the UPPER OUTER QUADRANT of the RIGHT breast.

EXAM:
2D AND TOMOSYNTHESIS DIAGNOSTIC RIGHT MAMMOGRAM POST ULTRASOUND
BIOPSY

[R CC]
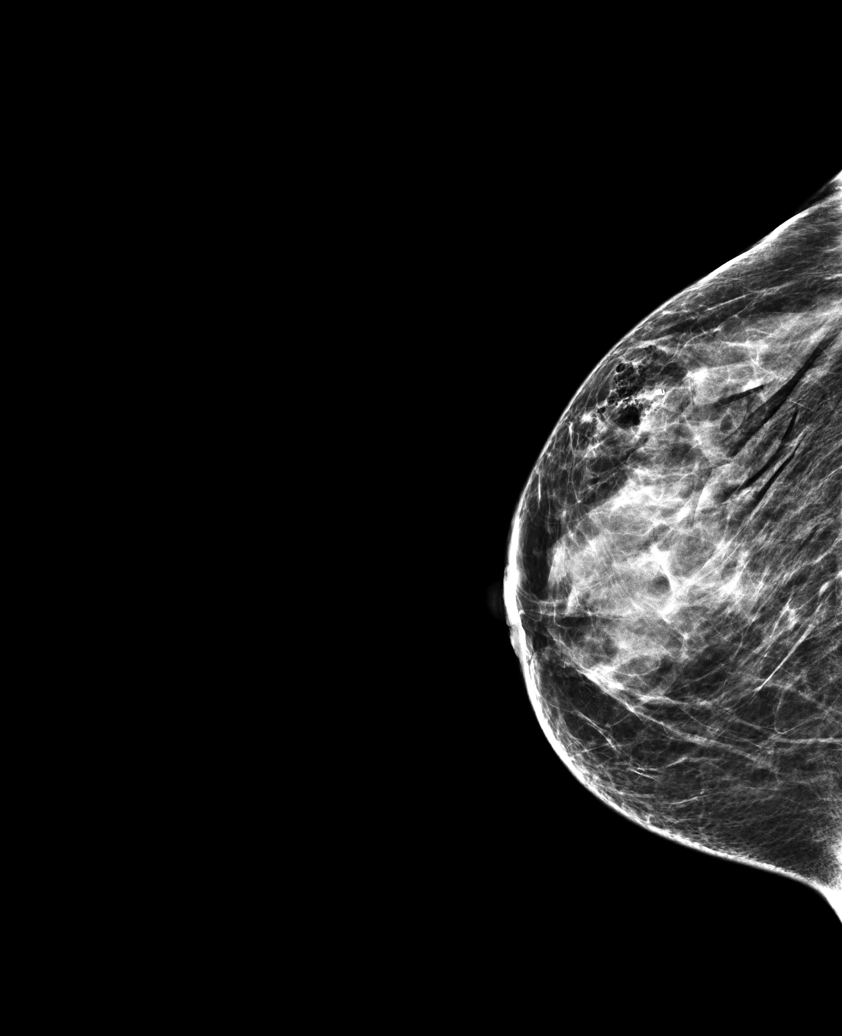

[R ML synth-2D]
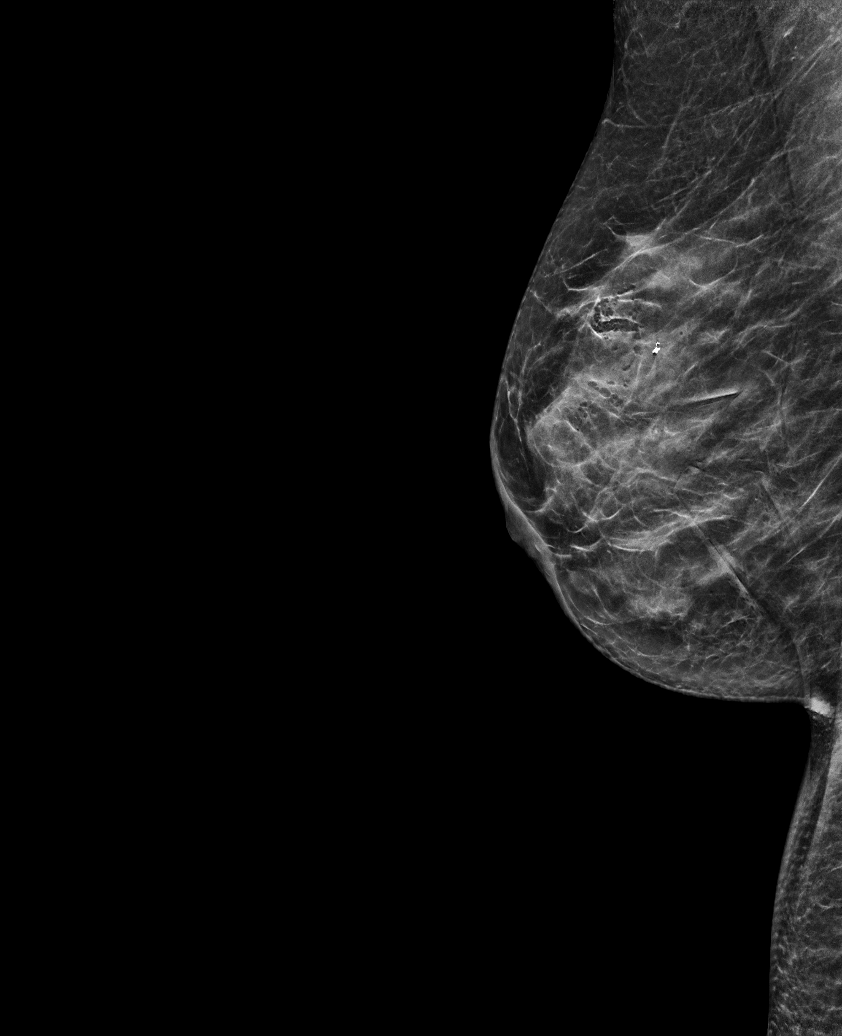

[R CC synth-2D]
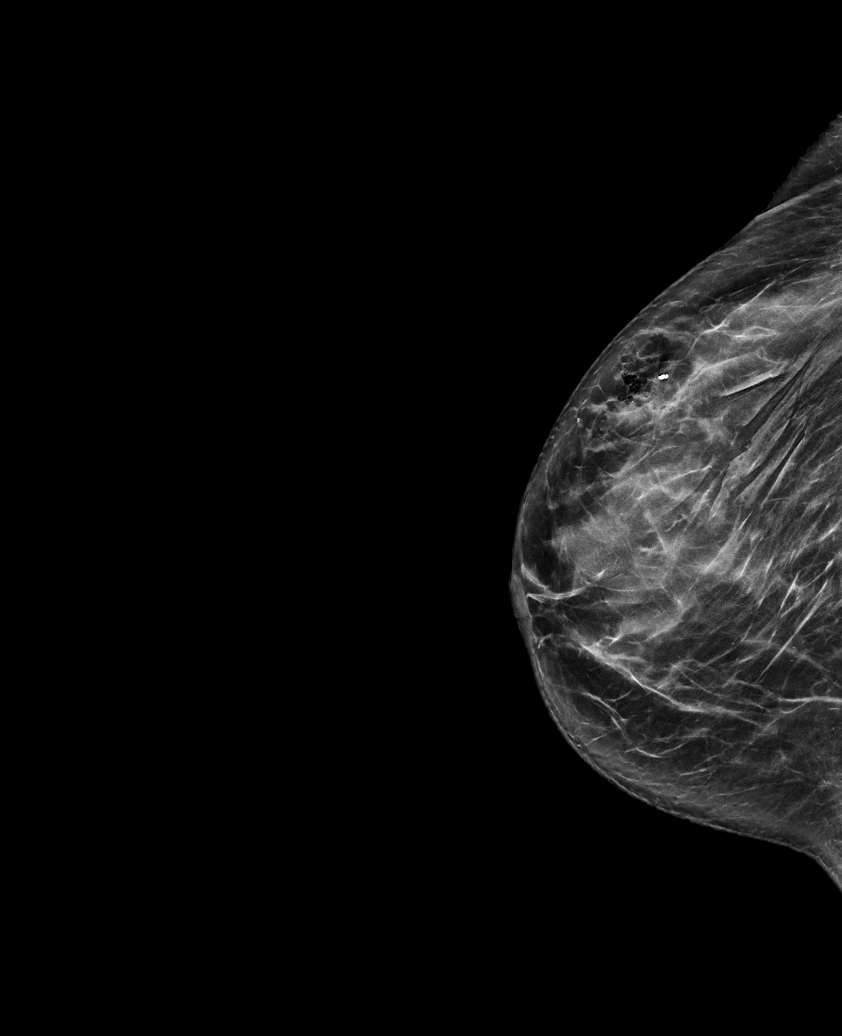

[R CC tomo · 2 of 63 frames shown]
[frame 21/63]
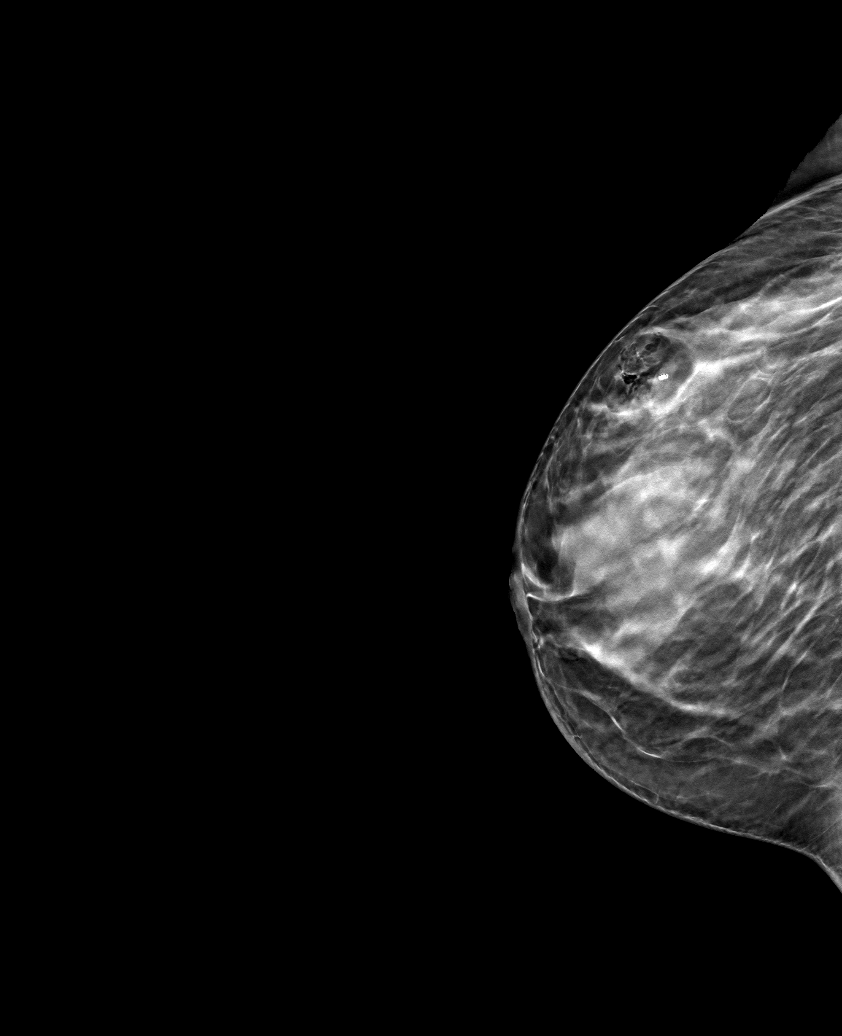
[frame 32/63]
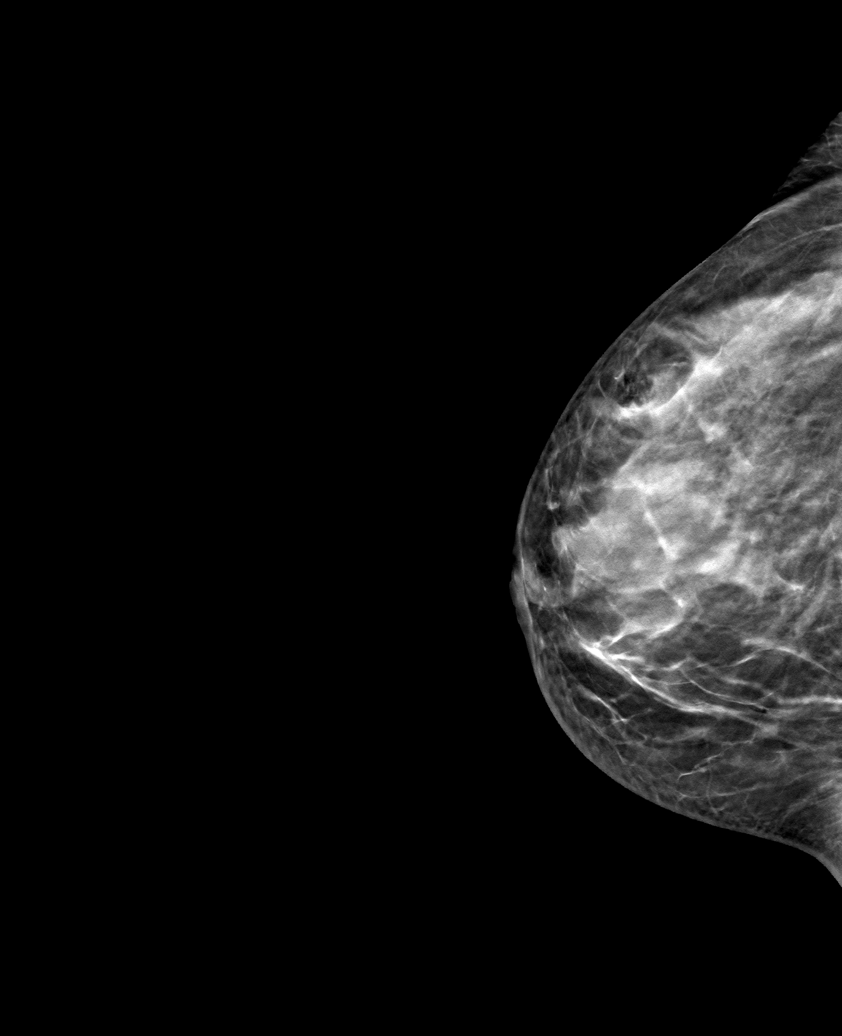

[R ML tomo · tomo slice 31/61.0]
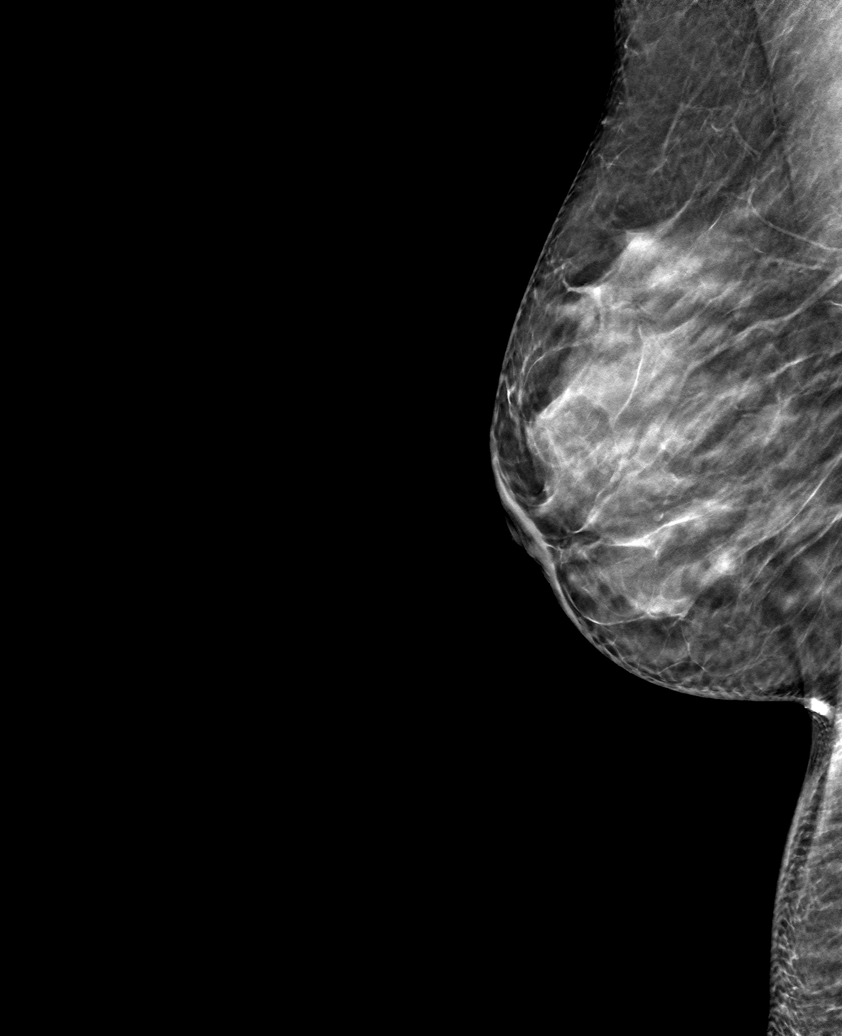

[6 of 13 positions shown; findings below may reference images not displayed]

FINDINGS: Tomosynthesis and synthesized full field CC and mediolateral images
were obtained following ultrasound guided biopsy of an indeterminate
mass in the UPPER OUTER QUADRANT of the RIGHT breast. The coil
shaped tissue marking clip is appropriately positioned within the
biopsied mass in the UPPER OUTER QUADRANT.

Expected post biopsy changes are present without evidence of
hematoma.
IMPRESSION: Appropriate positioning of the coil shaped biopsy marking clip
within the biopsied mass in the UPPER OUTER QUADRANT of the RIGHT
breast.

Final Assessment: Post Procedure Mammograms for Marker Placement
